# Patient Record
Sex: Female | Born: 1990 | Race: Black or African American | Hispanic: No | Marital: Single | Smoking: Never smoker
Health system: Southern US, Community
[De-identification: ages and names within clinical notes are randomized; demographics above are authoritative.]

---
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
## 2015-06-18 ENCOUNTER — Emergency Department (INDEPENDENT_AMBULATORY_CARE_PROVIDER_SITE_OTHER)
Admission: EM | Admit: 2015-06-18 | Discharge: 2015-06-18 | Disposition: A | Discharge status: Home / Self Care | Payer: BLUE CROSS/BLUE SHIELD | Source: Home / Self Care | Attending: Family Medicine | Admitting: Family Medicine

## 2015-06-18 ENCOUNTER — Encounter: Payer: Self-pay | Admitting: Emergency Medicine

## 2015-06-18 DIAGNOSIS — R079 Chest pain, unspecified: Secondary | ICD-10-CM | POA: Diagnosis not present

## 2015-06-18 DIAGNOSIS — M940 Chondrocostal junction syndrome [Tietze]: Secondary | ICD-10-CM | POA: Diagnosis not present

## 2015-06-18 MED ORDER — MELOXICAM 15 MG PO TABS: 15.0000 mg | ORAL_TABLET | Freq: Every day | ORAL | Status: AC

## 2015-06-18 NOTE — ED Provider Notes (Signed)
CSN: [id number]     Arrival date & time 06/18/15  1154 History   First MD Initiated Contact with Patient 06/18/15 1214     Chief Complaint  Patient presents with  . Chest Pain      HPI Comments: Patient awoke with sharp substernal and left chest this morning, non-radiating.  No nausea/vomiting or diaphoresis. She states that she lifts heavy items at work and has had mild intermittent pain in her chest when lifting during the past week.  Yesterday she worked out on an elliptical for 30 minutes at about 10pm.  She states that she has also started lifting weights.  While at work today she developed recurrent anterior chest pain while lifting boxes.  No shortness of breath.  Her pain is worse with deep inspiration.  No leg pain or swelling. No past history of cardiac problems.  She has a family history of CV disease.  Patient is a 25 y.o. female presenting with chest pain. The history is provided by the patient.  Chest Pain Pain location:  Substernal area Pain quality: sharp and stabbing   Pain quality: not radiating   Pain radiates to:  Does not radiate Pain radiates to the back: no   Pain severity:  Mild Onset quality:  Sudden Duration:  5 hours Timing:  Sporadic Progression:  Worsening Chronicity:  Recurrent Context: breathing and movement   Relieved by:  None tried Worsened by:  Coughing, movement and certain positions Ineffective treatments:  None tried Associated symptoms: no abdominal pain, no AICD problem, no back pain, no cough, no diaphoresis, no dysphagia, no fatigue, no fever, no heartburn, no lower extremity edema, no nausea, no palpitations, no shortness of breath and not vomiting   Risk factors: obesity     Past Medical History  Diagnosis Date  . Asthma    History reviewed. No pertinent past surgical history. Family History  Problem Relation Age of Onset  . Heart attack      grandparent  . Diabetes      grandparent  . Hyperlipidemia      grandparent  .  Hypertension      grandparent   Social History  Substance Use Topics  . Smoking status: Never Smoker   . Smokeless tobacco: Never Used  . Alcohol Use: No   OB History    No data available     Review of Systems  Constitutional: Negative for fever, diaphoresis and fatigue.  HENT: Negative for trouble swallowing.   Respiratory: Negative for cough and shortness of breath.   Cardiovascular: Positive for chest pain. Negative for palpitations.  Gastrointestinal: Negative for heartburn, nausea, vomiting and abdominal pain.  Musculoskeletal: Negative for back pain.  All other systems reviewed and are negative.   Allergies  Review of patient's allergies indicates no known allergies.  Home Medications   Prior to Admission medications   Medication Sig Start Date End Date Taking? Authorizing Provider  meloxicam (MOBIC) 15 MG tablet Take 1 tablet (15 mg total) by mouth daily. Take with food each morning 06/18/15   Lattie Haw, MD   Meds Ordered and Administered this Visit  Medications - No data to display  BP 131/70 mmHg  Pulse 96  Temp(Src) 98.1 F (36.7 C) (Oral)  Wt 252 lb (114.306 kg)  SpO2 100%  LMP 06/18/2015 (Exact Date) No data found.   Physical Exam Nursing notes and Vital Signs reviewed. Appearance:  Patient appears stated age, and in no acute distress.  Patient is obese. Eyes:  Pupils are equal, round, and reactive to light and accomodation.  Extraocular movement is intact.  Conjunctivae are not inflamed  Nose:  Normal Pharynx:  Normal Neck:  Supple.  No adenopathy Lungs:  Clear to auscultation.  Breath sounds are equal.  Moving air well. Chest:  Distinct tenderness to palpation over the mid-sternum.  Palpation of the sternum during resisted contraction of the pectoralis muscles recreates her chest pain. Heart:  Regular rate and rhythm without murmurs, rubs, or gallops.  Abdomen:  Nontender without masses or hepatosplenomegaly.  Bowel sounds are present.  No CVA  or flank tenderness.  Extremities:  No edema.  Skin:  No rash present.   ED Course  Procedures none     Labs Reviewed -   EKG: Rate:  92 BPM PR:  168 msec QT:  336 msec QTcH:  392 msec QRSD:  82 msec QRS axis:  43 degrees Interpretation:  within normal limits; sinus rhythm    MDM   1. Costochondritis    Begin Mobic  QAM Apply ice pack for 20 to 30 minutes, 3 to 4 times daily  Continue until pain decreases.  Avoid heavy lifting until improved.  Warm up before athletic activity. Followup with Dr. Rodney Langton or Dr. Clementeen [facility] ([facility]) if not improving about two weeks.     Lattie Haw, MD 06/23/15 [id number]

## 2015-06-18 NOTE — Discharge Instructions (Signed)
Apply ice pack for 20 to 30 minutes, 3 to 4 times daily  Continue until pain decreases.  Avoid heavy lifting until improved.  Warm up before athletic activity.   Costochondritis Costochondritis, sometimes called Tietze syndrome, is a swelling and irritation (inflammation) of the tissue (cartilage) that connects your ribs with your breastbone (sternum). It causes pain in the chest and rib area. Costochondritis usually goes away on its own over time. It can take up to 6 weeks or longer to get better, especially if you are unable to limit your activities. CAUSES  Some cases of costochondritis have no known cause. Possible causes include:  Injury (trauma).  Exercise or activity such as lifting.  Severe coughing. SIGNS AND SYMPTOMS  Pain and tenderness in the chest and rib area.  Pain that gets worse when coughing or taking deep breaths.  Pain that gets worse with specific movements. DIAGNOSIS  Your health care provider will do a physical exam and ask about your symptoms. Chest X-rays or other tests may be done to rule out other problems. TREATMENT  Costochondritis usually goes away on its own over time. Your health care provider may prescribe medicine to help relieve pain. HOME CARE INSTRUCTIONS   Avoid exhausting physical activity. Try not to strain your ribs during normal activity. This would include any activities using chest, abdominal, and side muscles, especially if heavy weights are used.  Apply ice to the affected area for the first 2 days after the pain begins.  Put ice in a plastic bag.  Place a towel between your skin and the bag.  Leave the ice on for 20 minutes, 2-3 times a day.  Only take over-the-counter or prescription medicines as directed by your health care provider. SEEK MEDICAL CARE IF:  You have redness or swelling at the rib joints. These are signs of infection.  Your pain does not go away despite rest or medicine. SEEK IMMEDIATE MEDICAL CARE IF:   Your  pain increases or you are very uncomfortable.  You have shortness of breath or difficulty breathing.  You cough up blood.  You have worse chest pains, sweating, or vomiting.  You have a fever or persistent symptoms for more than 2-3 days.  You have a fever and your symptoms suddenly get worse. MAKE SURE YOU:   Understand these instructions.  Will watch your condition.  Will get help right away if you are not doing well or get worse.   This information is not intended to replace advice given to you by your health care provider. Make sure you discuss any questions you have with your health care provider.   Document Released: 02/22/2005 Document Revised: 03/05/2013 Document Reviewed: 12/17/2012 Elsevier Interactive Patient Education [employer]

## 2015-06-18 NOTE — ED Notes (Signed)
Pt c/u sudden onset of chest pain after working out yesterday. States pain is constant and worse with deep breath. No hx chest pain.

## 2015-06-21 ENCOUNTER — Encounter: Payer: Self-pay | Admitting: Family Medicine

## 2015-06-21 ENCOUNTER — Ambulatory Visit (INDEPENDENT_AMBULATORY_CARE_PROVIDER_SITE_OTHER): Payer: BLUE CROSS/BLUE SHIELD | Admitting: Family Medicine

## 2015-06-21 VITALS — BP 121/73 | HR 95 | Wt 250.0 lb

## 2015-06-21 DIAGNOSIS — R0602 Shortness of breath: Secondary | ICD-10-CM | POA: Diagnosis not present

## 2015-06-21 DIAGNOSIS — R0789 Other chest pain: Secondary | ICD-10-CM

## 2015-06-21 LAB — COMPLETE METABOLIC PANEL WITH GFR
ALT: 9 U/L (ref 6–29)
AST: 10 U/L (ref 10–30)
Albumin: 3.9 g/dL (ref 3.6–5.1)
Alkaline Phosphatase: 63 U/L (ref 33–115)
BUN: 20 mg/dL (ref 7–25)
CO2: 23 mmol/L (ref 20–31)
Calcium: 9.2 mg/dL (ref 8.6–10.2)
Chloride: 103 mmol/L (ref 98–110)
Creat: 0.6 mg/dL (ref 0.50–1.10)
GFR, Est African American: >89 mL/min (ref >=60)
GFR, Est Non African American: >89 mL/min (ref >=60)
Glucose, Bld: 94 mg/dL (ref 65–99)
Potassium: 4.6 mmol/L (ref 3.5–5.3)
Sodium: 140 mmol/L (ref 135–146)
Total Bilirubin: 0.3 mg/dL (ref 0.2–1.2)
Total Protein: 6.9 g/dL (ref 6.1–8.1)

## 2015-06-21 LAB — CBC WITH DIFFERENTIAL/PLATELET
Basophils Absolute: 0.1 10*3/uL (ref 0.0–0.1)
Basophils Relative: 1 % (ref 0–1)
Eosinophils Absolute: 0.2 10*3/uL (ref 0.0–0.7)
Eosinophils Relative: 3 % (ref 0–5)
HCT: 36.7 % (ref 36.0–46.0)
Hemoglobin: 11.9 g/dL — ABNORMAL LOW (ref 12.0–15.0)
Lymphocytes Relative: 36 % (ref 12–46)
Lymphs Abs: 2.4 10*3/uL (ref 0.7–4.0)
MCH: 23.7 pg — ABNORMAL LOW (ref 26.0–34.0)
MCHC: 32.4 g/dL (ref 30.0–36.0)
MCV: 73.1 fL — ABNORMAL LOW (ref 78.0–100.0)
MPV: 9.1 fL (ref 8.6–12.4)
Monocytes Absolute: 0.4 10*3/uL (ref 0.1–1.0)
Monocytes Relative: 6 % (ref 3–12)
Neutro Abs: 3.6 10*3/uL (ref 1.7–7.7)
Neutrophils Relative %: 54 % (ref 43–77)
Platelets: 300 10*3/uL (ref 150–400)
RBC: 5.02 MIL/uL (ref 3.87–5.11)
RDW: 16.8 % — ABNORMAL HIGH (ref 11.5–15.5)
WBC: 6.6 10*3/uL (ref 4.0–10.5)

## 2015-06-21 LAB — FERRITIN: Ferritin: 46 ng/mL (ref 10–291)

## 2015-06-21 LAB — TSH: TSH: 0.78 u[IU]/mL (ref 0.350–4.500)

## 2015-06-21 MED ORDER — ALBUTEROL SULFATE HFA 108 (90 BASE) MCG/ACT IN AERS: 2.0000 | INHALATION_SPRAY | Freq: Four times a day (QID) | RESPIRATORY_TRACT | Status: AC | PRN

## 2015-06-21 NOTE — Progress Notes (Signed)
   Subjective:    Patient ID: Tracy Gibbs, female    DOB: 1990-09-29, 25 y.o.   MRN: [id number]  HPI Here for follow-up for shortness of breath. She was went to urgent care last Friday on January 20 for shortness of breath that started abruptly. The night before she actually worked out and exercise and have a little chest discomfort. But the shortness of breath didn't start until the following day while she was at work. She works in a [employer]. She's been asymptomatic since then. Urgent care she was diagnosed with costochondritis. She had a URI the week before this started. Still some residaul cough. Not on OCPs.  No fam hx of blood clots  No long car rides recently.    Review of Systems     Objective:   Physical Exam  Constitutional: She is oriented to person, place, and time. She appears well-developed and well-nourished.  HENT:  Head: Normocephalic and atraumatic.  Neck: Neck supple. No thyromegaly present.  Cardiovascular: Normal rate, regular rhythm and normal heart sounds.   Pulmonary/Chest: Effort normal and breath sounds normal.  Musculoskeletal: She exhibits no edema.  Lymphadenopathy:    She has no cervical adenopathy.  Neurological: She is alert and oriented to person, place, and time.  Skin: Skin is warm and dry.  Psychiatric: She has a normal mood and affect. Her behavior is normal.    She is tender along the left upper chest wall near the sternum.      Assessment & Plan:  SOB with atypical chest pain - consider costochondritis. She did have another episode of discomfort over the weekend and says she had let her sister drive the car. She is tender on the left upper chest wall near the sternum. Will evaluate for anemia, thyroid disorder and will check a d-dimer that she is low risk. Call with results once available. Okay to use the anti-inflammatory that she was given for now and heating pad or ice as needed

## 2015-06-22 LAB — D-DIMER, QUANTITATIVE (NOT AT ARMC): D-Dimer, Quant: 0.22 ug/mL-FEU (ref 0.00–0.48)

## 2015-08-04 ENCOUNTER — Encounter: Payer: Self-pay | Admitting: Family Medicine

## 2015-08-04 DIAGNOSIS — G44209 Tension-type headache, unspecified, not intractable: Secondary | ICD-10-CM | POA: Insufficient documentation

## 2015-10-29 ENCOUNTER — Other Ambulatory Visit: Payer: Self-pay | Admitting: *Deleted

## 2015-10-29 NOTE — Telephone Encounter (Signed)
Error

## 2016-01-12 ENCOUNTER — Emergency Department (INDEPENDENT_AMBULATORY_CARE_PROVIDER_SITE_OTHER)
Admission: EM | Admit: 2016-01-12 | Discharge: 2016-01-12 | Disposition: A | Discharge status: Home / Self Care | Payer: BLUE CROSS/BLUE SHIELD | Source: Home / Self Care | Attending: Family Medicine | Admitting: Family Medicine

## 2016-01-12 DIAGNOSIS — R300 Dysuria: Secondary | ICD-10-CM | POA: Diagnosis not present

## 2016-01-12 DIAGNOSIS — R102 Pelvic and perineal pain: Secondary | ICD-10-CM

## 2016-01-12 DIAGNOSIS — R319 Hematuria, unspecified: Secondary | ICD-10-CM

## 2016-01-12 LAB — POCT URINALYSIS DIP (MANUAL ENTRY)
Bilirubin, UA: NEGATIVE
Glucose, UA: NEGATIVE
Ketones, POC UA: NEGATIVE
Nitrite, UA: NEGATIVE
Protein Ur, POC: >=300 — AB
Spec Grav, UA: >=1.03 (ref 1.005–1.03)
Urobilinogen, UA: 0.2 (ref 0–1)
pH, UA: 5.5 (ref 5–8)

## 2016-01-12 NOTE — ED Provider Notes (Signed)
CSN: [id number]     Arrival date & time 01/12/16  1613 History   First MD Initiated Contact with Patient 01/12/16 1703     Chief Complaint  Patient presents with  . Urinary Frequency    and burning   (Consider location/radiation/quality/duration/timing/severity/associated sxs/prior Treatment) HPI Tracy Gibbs is a 25 y.o. female presenting to UC with c/o burning with urination and urinary urgency for about 3 days. She also reports mild vaginal itching and irritation.  She has been drinking cranberry juice and water w/o relief. Pt is sexually active but denies concern for STDs as she does use condoms.  Denies fever, chills, n/v/d. Hx of UTI a few years ago, symptoms feel similar. Denies lower abdominal pain or back pain.    Past Medical History:  Diagnosis Date  . Asthma    History reviewed. No pertinent surgical history. Family History  Problem Relation Age of Onset  . Heart attack      grandparent  . Diabetes      grandparent  . Hyperlipidemia      grandparent  . Hypertension      grandparent   Social History  Substance Use Topics  . Smoking status: Never Smoker  . Smokeless tobacco: Never Used  . Alcohol use No   OB History    No data available     Review of Systems  Constitutional: Negative for chills and fever.  Gastrointestinal: Negative for abdominal pain, diarrhea, nausea and vomiting.  Genitourinary: Positive for dysuria, frequency, urgency and vaginal pain ( itching and irritation). Negative for hematuria, pelvic pain, vaginal bleeding and vaginal discharge.    Allergies  Review of patient's allergies indicates no known allergies.  Home Medications   Prior to Admission medications   Medication Sig Start Date End Date Taking? Authorizing Provider  albuterol (PROVENTIL HFA;VENTOLIN HFA) 108 (90 Base) MCG/ACT inhaler Inhale 2 puffs into the lungs every 6 (six) hours as needed for wheezing or shortness of breath. 06/21/15   Agapito Gamesatherine D Metheney, MD   meloxicam (MOBIC) 15 MG tablet Take 1 tablet (15 mg total) by mouth daily. Take with food each morning 06/18/15   Lattie HawStephen A Beese, MD   Meds Ordered and Administered this Visit  Medications - No data to display  BP 115/81 (BP Location: Right Arm)   Pulse 104   Temp 98.6 F (37 C) (Oral)   Ht 5\' 1"  (1.549 m)   Wt 232 lb (105.2 kg)   LMP 01/12/2016   SpO2 99%   BMI 43.84 kg/m  No data found.   Physical Exam  Constitutional: She is oriented to person, place, and time. She appears well-developed and well-nourished.  HENT:  Head: Normocephalic and atraumatic.  Eyes: EOM are normal.  Neck: Normal range of motion.  Cardiovascular: Normal rate and regular rhythm.   Pulmonary/Chest: Effort normal. No respiratory distress.  Abdominal: Soft. She exhibits no distension and no mass. There is no tenderness. There is no rebound, no guarding and no CVA tenderness.  Genitourinary:  Genitourinary Comments: Chaperoned exam. Normal external genitalia. Small amount of thick white discharge. No bleeding. No rashes.   Musculoskeletal: Normal range of motion.  Neurological: She is alert and oriented to person, place, and time.  Skin: Skin is warm and dry.  Psychiatric: She has a normal mood and affect. Her behavior is normal.  Nursing note and vitals reviewed.   Urgent Care Course   Clinical Course    Procedures (including critical care time)  Labs Review Labs  Reviewed  POCT URINALYSIS DIP (MANUAL ENTRY) - Abnormal; Notable for the following:       Result Value   Blood, UA large (*)    Protein Ur, POC >=300 (*)    Leukocytes, UA small (1+) (*)    All other components within normal limits  URINE CULTURE  WET PREP, GENITAL  GC/CHLAMYDIA PROBE AMP    Imaging Review No results found.    MDM   1. Dysuria   2. Hematuria   3. Vaginal pain    Pt c/o urinary and vaginal symptoms.   UA: small leukocytes with protein and blood. Urine culture sent. Wet prep and GC/Chlamydia sent  off.  Will hold off on antibiotics until labs result in about 2 days.  Encouraged fluids. May try OTC Azo for urinary discomfort. Patient verbalized understanding and agreement with treatment plan.    Junius Finnerrin O'Malley, PA-C 01/12/16 1902

## 2016-01-12 NOTE — Discharge Instructions (Signed)
You may try over the counter medication called Azo to help with bladder spasms.  This medication can make your urine orange, which is normal.  ° ° °

## 2016-01-12 NOTE — ED Triage Notes (Signed)
Pt has been having burning and urgency with urination x 3 days.  Denies fever, has been drinking cranberry juice and water.

## 2016-01-14 ENCOUNTER — Telehealth: Payer: Self-pay | Admitting: *Deleted

## 2016-01-14 LAB — URINE CULTURE: Organism ID, Bacteria: 10000

## 2016-01-14 LAB — GC/CHLAMYDIA PROBE AMP

## 2016-01-14 NOTE — Telephone Encounter (Signed)
LMOM to return call to office.  **When patient calls back, advise of UCX results, wet prep will take another day or 2 as code was incorrect. G/c unable to be performed as wrong swab sent off. May return to recollect if she wishes to do so.

## 2016-01-15 LAB — WET PREP BY MOLECULAR PROBE
Candida species: NEGATIVE
Gardnerella vaginalis: NEGATIVE
Trichomonas vaginosis: NEGATIVE

## 2016-01-18 NOTE — Telephone Encounter (Signed)
Callback: Pt reports her symptoms are starting to improve, she is taking AZO and increasing fluid intake. Advised of UCX, wet prep results and inability to perform g/c. She does not wish to return for further testing. Advised to call back as needed.
